# Patient Record
Sex: Male | Born: 1970 | Hispanic: Yes | Marital: Single | Smoking: Never smoker
Health system: Southern US, Community
[De-identification: ages and names within clinical notes are randomized; demographics above are authoritative.]

## PROBLEM LIST (undated history)

## (undated) HISTORY — PX: APPENDECTOMY: SHX54

---

## 2005-07-28 ENCOUNTER — Inpatient Hospital Stay: Payer: Self-pay | Admitting: General Surgery

## 2009-08-27 ENCOUNTER — Emergency Department: Payer: Self-pay | Admitting: Emergency Medicine

## 2010-04-30 ENCOUNTER — Ambulatory Visit: Payer: Self-pay | Admitting: Family Medicine

## 2012-01-14 IMAGING — US ABDOMEN ULTRASOUND LIMITED
1 series · 17 of 25 positions shown · non-contrast
Comparison: none

REASON FOR EXAM: Elevated LFT
COMMENTS:

[Series 1: abdomen ultrasound limited · 17 of 65 slices shown]
[im 1/65]
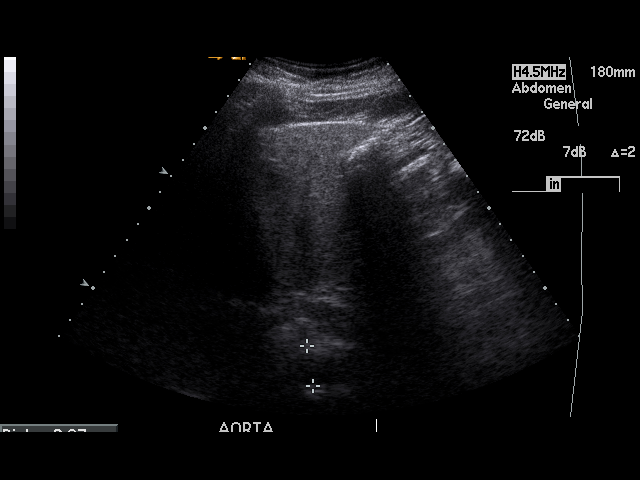
[im 6/65]
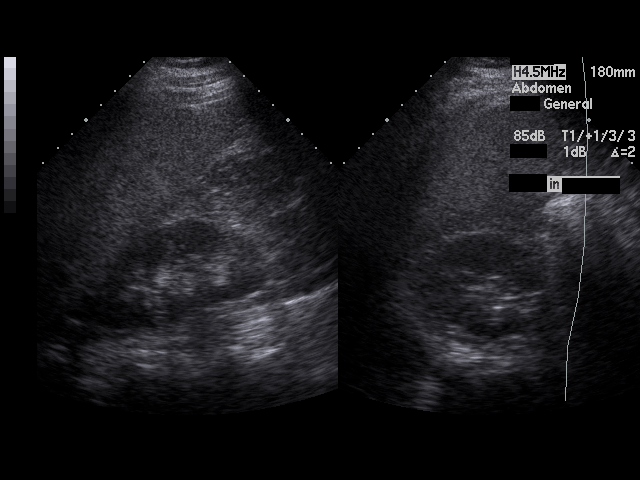
[im 9/65]
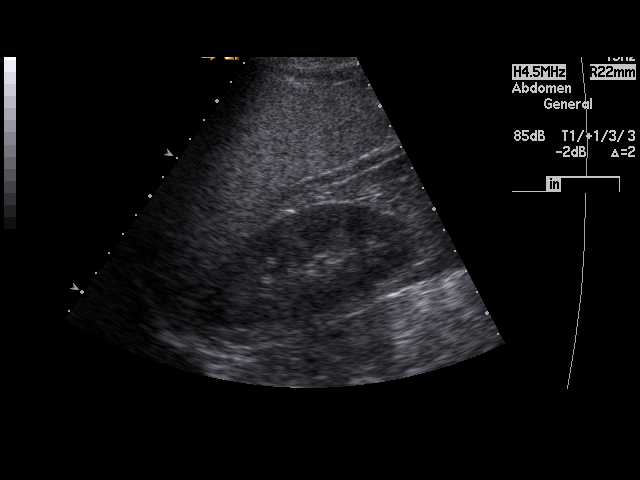
[im 14/65]
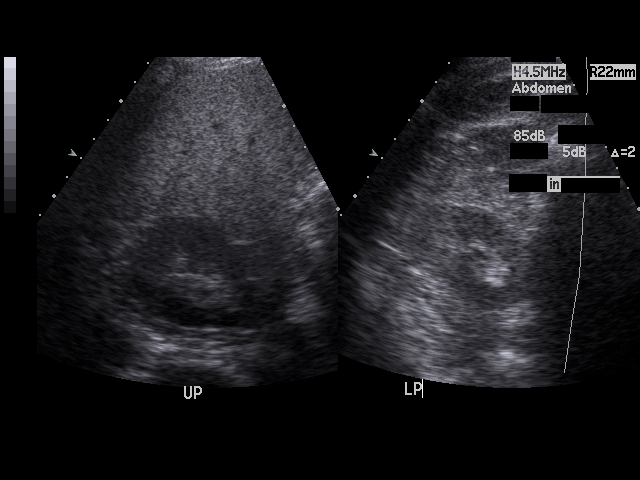
[im 17/65]
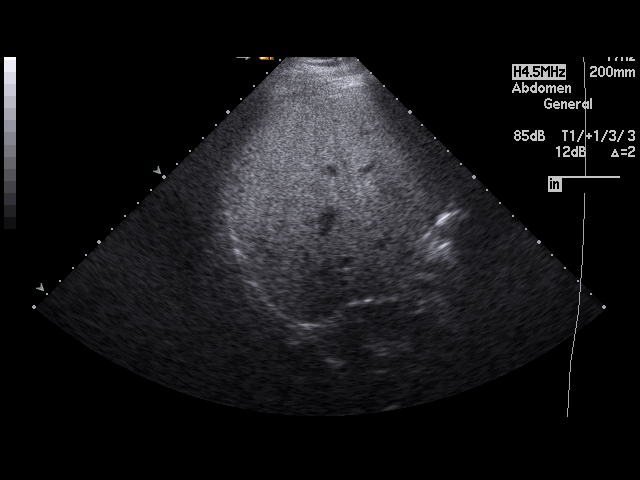
[im 22/65]
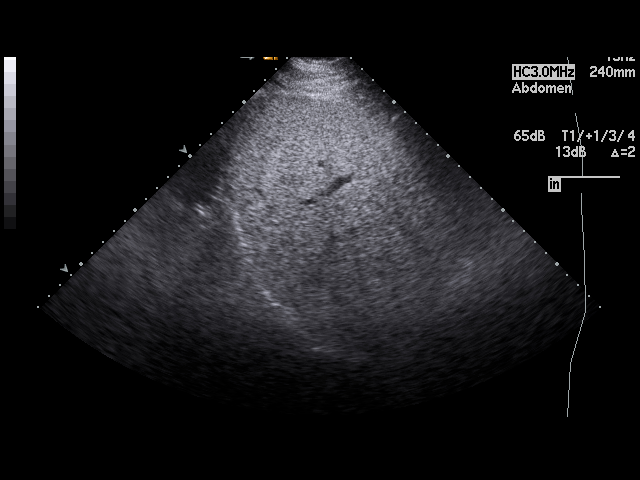
[im 25/65]
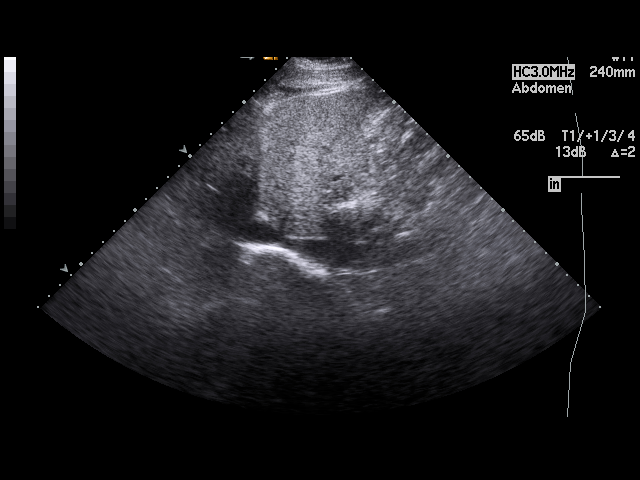
[im 30/65]
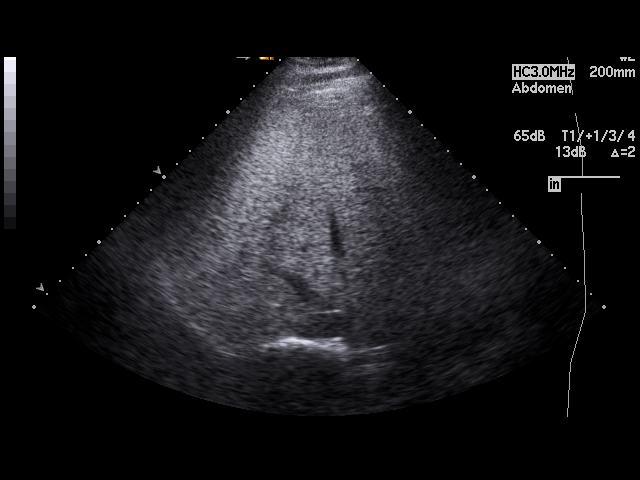
[im 33/65]
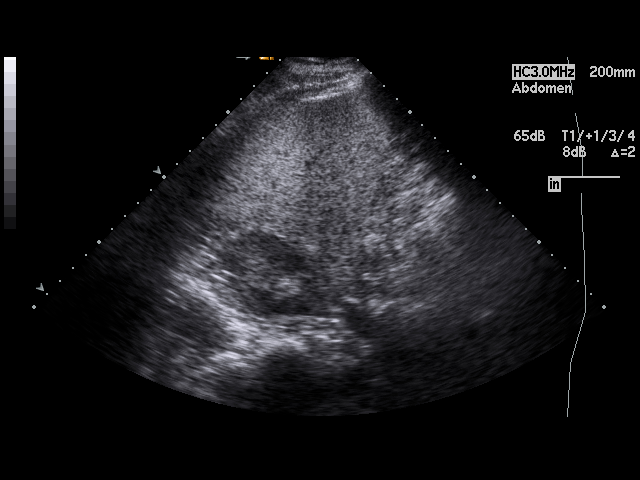
[im 35/65]
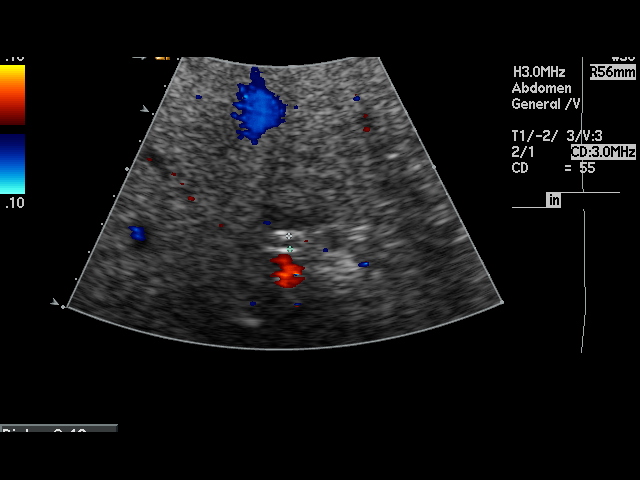
[im 41/65]
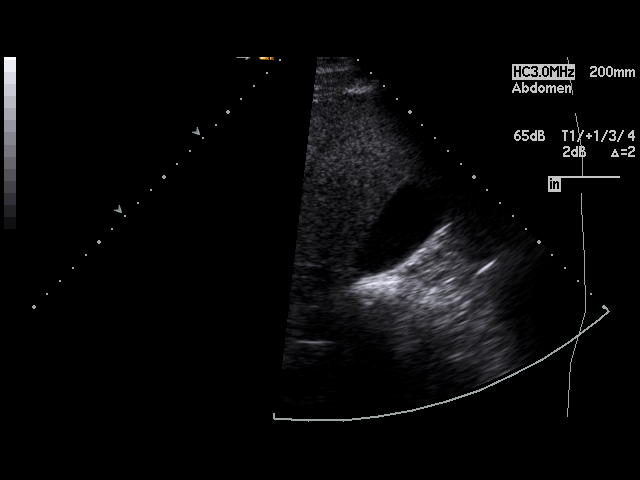
[im 43/65]
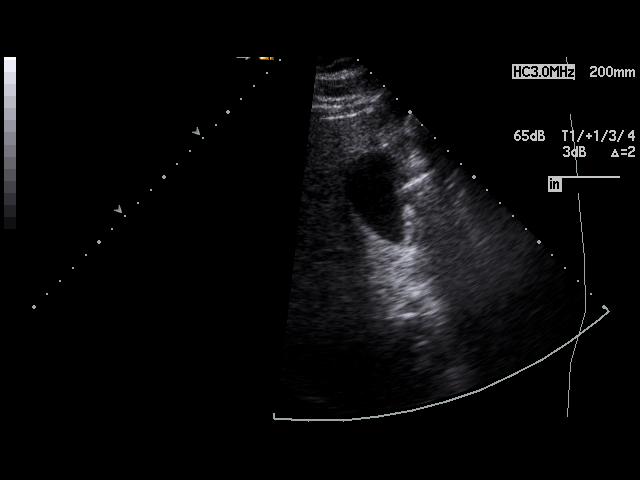
[im 49/65]
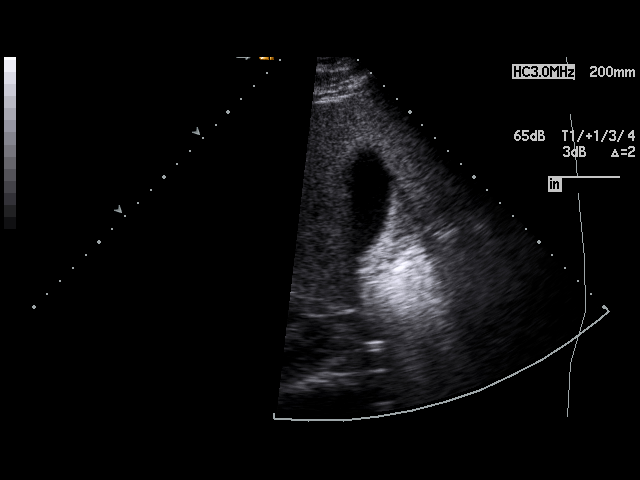
[im 51/65]
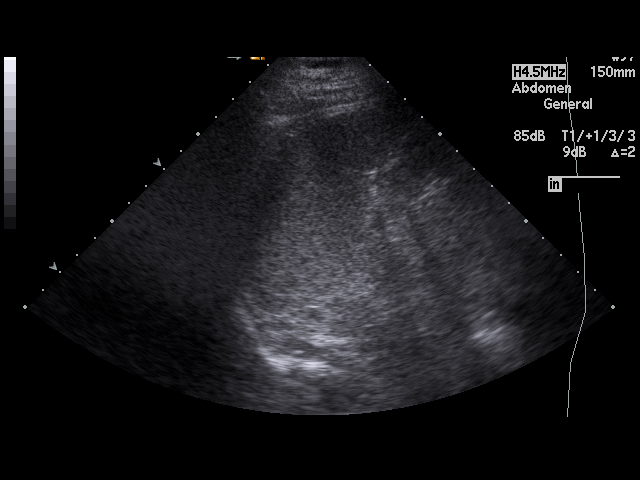
[im 57/65]
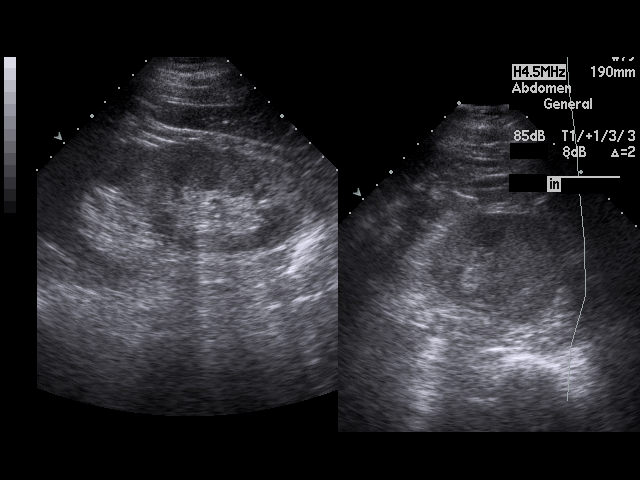
[im 59/65]
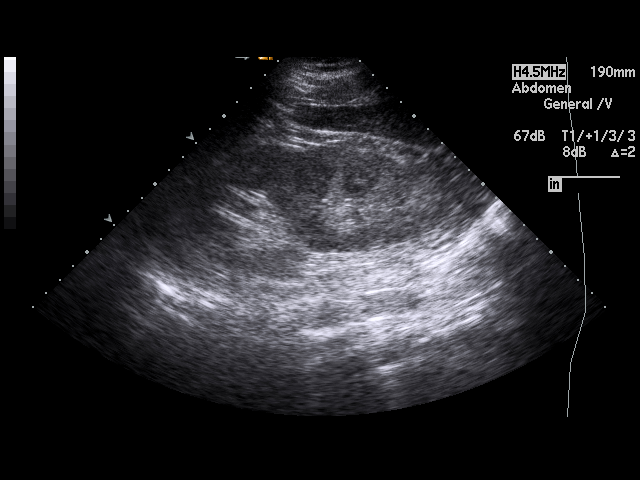
[im 65/65]
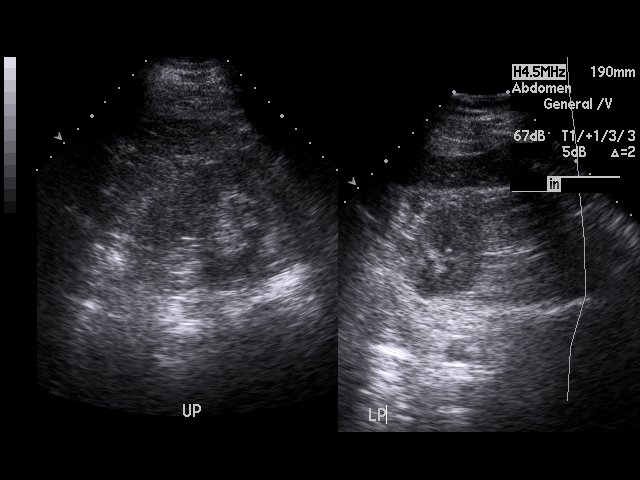

[17 of 25 positions shown; findings below may reference images not displayed]

PROCEDURE:     SPROUTS - SPROUTS ABDOMEN UPPER GENERAL  - April 30, 2010  [DATE]

RESULT:     The liver is mildly hyperechogenic, suspicious for fatty
infiltration. No focal hepatic mass lesions are seen. The pancreas is not
visualized adequately for evaluation on this exam. The spleen is normal in
size. The abdominal aorta and inferior vena cava show no significant
abnormalities. No gallstones are seen. There is no thickening of the
gallbladder wall. The common bile duct measures 4.3 mm in diameter which is
within normal limits. The kidneys show no hydronephrosis. There is no
ascites.
IMPRESSION: 1. No gallstones or other acute change is identified.
2. Possible fatty infiltration of the liver.
3. The pancreas is in great part obscured by bowel gas on this exam.

## 2022-03-23 ENCOUNTER — Emergency Department
Admission: EM | Admit: 2022-03-23 | Discharge: 2022-03-23 | Disposition: A | Discharge status: Home / Self Care | Payer: Self-pay | Attending: Emergency Medicine | Admitting: Emergency Medicine

## 2022-03-23 ENCOUNTER — Other Ambulatory Visit: Payer: Self-pay

## 2022-03-23 ENCOUNTER — Emergency Department: Payer: Self-pay

## 2022-03-23 DIAGNOSIS — F419 Anxiety disorder, unspecified: Secondary | ICD-10-CM | POA: Insufficient documentation

## 2022-03-23 DIAGNOSIS — Z8616 Personal history of COVID-19: Secondary | ICD-10-CM | POA: Insufficient documentation

## 2022-03-23 DIAGNOSIS — R0789 Other chest pain: Secondary | ICD-10-CM | POA: Insufficient documentation

## 2022-03-23 DIAGNOSIS — R519 Headache, unspecified: Secondary | ICD-10-CM | POA: Insufficient documentation

## 2022-03-23 LAB — BASIC METABOLIC PANEL
Anion gap: 6 (ref 5–15)
BUN: 22 mg/dL — ABNORMAL HIGH (ref 6–20)
CO2: 24 mmol/L (ref 22–32)
Calcium: 8.9 mg/dL (ref 8.9–10.3)
Chloride: 108 mmol/L (ref 98–111)
Creatinine, Ser: 0.89 mg/dL (ref 0.61–1.24)
GFR, Estimated: >60 mL/min (ref >60)
Glucose, Bld: 116 mg/dL — ABNORMAL HIGH (ref 70–99)
Potassium: 3.9 mmol/L (ref 3.5–5.1)
Sodium: 138 mmol/L (ref 135–145)

## 2022-03-23 LAB — CBC
HCT: 41.7 % (ref 39.0–52.0)
Hemoglobin: 13.8 g/dL (ref 13.0–17.0)
MCH: 29.7 pg (ref 26.0–34.0)
MCHC: 33.1 g/dL (ref 30.0–36.0)
MCV: 89.7 fL (ref 80.0–100.0)
Platelets: 275 10*3/uL (ref 150–400)
RBC: 4.65 MIL/uL (ref 4.22–5.81)
RDW: 12.9 % (ref 11.5–15.5)
WBC: 6.9 10*3/uL (ref 4.0–10.5)
nRBC: 0 % (ref 0.0–0.2)

## 2022-03-23 LAB — TROPONIN I (HIGH SENSITIVITY)
Troponin I (High Sensitivity): <2 ng/L (ref <18)
Troponin I (High Sensitivity): 3 ng/L (ref <18)

## 2022-03-23 NOTE — ED Triage Notes (Signed)
Pt presents to ER from home with c/o posterior headache for a few days.  Pt also states he has had some slight pain in his chest and has been having some sob.  Pt endorses some hx of anxiety, and was taking zoloft, but has stopped a while back, as he was feeling better.  Pt is A&O x4 at this time in NAD.

## 2022-03-23 NOTE — ED Provider Notes (Signed)
[facility] Provider Note    Event Date/Time   First MD Initiated Contact with Patient 03/23/22 [phone]     (approximate)   History   Chest Pain and Headache   HPI  Travis Navarro is a 51 y.o. male who reports a history of anxiety and a prior episode of COVID-19 about 2 years ago that he feels he is not completely recovered from.  He presents tonight for an evaluation of multiple complaints.  He said that he had a headache off and on for a few days.  Occasionally also has some pains in his chest and shortness of breath but he says that the symptoms have been going on since he had COVID.  He has a history of anxiety and said the symptoms he has had panic attacks that also causes similar symptoms, but he stopped taking Zoloft "a long time ago" because he did not think he needed it anymore.  He said that sometimes he has episodes where he forgets " how to talk and how to think", but then the symptoms go away again.  This happens on and off, but most recently happened earlier tonight after he got to the emergency department.  He states it happened when he arrived, but the triage nurse did not comment on any abnormalities.     Physical Exam   Triage Vital Signs: ED Triage Vitals  Enc Vitals Group     BP 03/23/22 0230 135/90     Pulse Rate 03/23/22 0230 60     Resp 03/23/22 0230 20     Temp 03/23/22 0230 98.3 F (36.8 C)     Temp Source 03/23/22 0230 Oral     SpO2 03/23/22 0230 98 %     Weight 03/23/22 0243 77.1 kg (170 lb)     Height 03/23/22 0243 1.702 m (5\' 7" )     Head Circumference --      Peak Flow --      Pain Score 03/23/22 0243 0     Pain Loc --      Pain Edu? --      Excl. in GC? --     Most recent vital signs: Vitals:   03/23/22 0230 03/23/22 0512  BP: 135/90 136/83  Pulse: 60 (!) 56  Resp: 20 18  Temp: 98.3 F (36.8 C)   SpO2: 98% 100%     General: Awake, no distress.  Generally well-appearing. CV:  Good peripheral perfusion.  Normal  heart sounds. Resp:  Normal effort.  Lungs are clear to auscultation bilaterally. Abd:  No distention.  No tenderness to palpation of the abdomen. Other:  Neurologically intact, ambulatory without difficulty, speaking clearly without difficulty, no word finding problems, no cerebellar deficits.   ED Results / Procedures / Treatments   Labs (all labs ordered are listed, but only abnormal results are displayed) Labs Reviewed  BASIC METABOLIC PANEL - Abnormal; Notable for the following components:      Result Value   Glucose, Bld 116 (*)    BUN 22 (*)    All other components within normal limits  CBC  TROPONIN I (HIGH SENSITIVITY)  TROPONIN I (HIGH SENSITIVITY)     RADIOLOGY I viewed and interpreted the patient's head CT and two-view chest x-ray.  I see no acute abnormalities on either including but not limited to intracranial bleed, neoplasm, pneumonia, pneumothorax.    PROCEDURES:  Critical Care performed: No  Procedures   MEDICATIONS ORDERED IN ED: Medications - No  data to display   IMPRESSION / MDM / ASSESSMENT AND PLAN / ED COURSE  I reviewed the triage vital signs and the nursing notes.                              Differential diagnosis includes, but is not limited to, anxiety, lung COVID, ACS, PE, pneumonia, viral infection.  Patient's presentation is most consistent with acute presentation with potential threat to life or bodily function.  Fortunately the patient's work-up was reassuring.  His vital signs are stable and within normal limits.  He has a well score for PE of 0.  He is low risk for ACS based on HEAR score.  Labs/studies ordered include: Head CT, two-view chest x-ray, basic metabolic panel, CBC, high-sensitivity troponin x2.  All of his labs are within normal limits including 2 troponins.  No acute abnormalities on head CT or chest x-ray.  Patient is not having chest pain and has not recently, no EKG at this time.  Patient's evaluation has been  reassuring and I suspect anxiety is playing a large role.  He was asking me about if he has diabetes and other questions more appropriate for primary care.  He explained that he try to go back to his primary care doctor but he had not been in 3 years so they took him off the list.  I provided reassurance tonight and encouraged him to establish care at the [facility] [place].  I gave my usual and customary return precautions and follow-up recommendations, and he understands and agrees with the plan.  He is in good spirits at the time of discharge and feels better.         FINAL CLINICAL IMPRESSION(S) / ED DIAGNOSES   Final diagnoses:  Nonintractable headache, unspecified chronicity pattern, unspecified headache type  Anxiety  Atypical chest pain     Rx / DC Orders   ED Discharge Orders     None        Note:  This document was prepared using Dragon voice recognition software and may include unintentional dictation errors.   Loleta Rose, MD 03/23/22 [phone]

## 2022-03-23 NOTE — ED Notes (Signed)
Pt in ct
